# Patient Record
Sex: Male | Born: 1995 | Race: Black or African American | Hispanic: No | Marital: Single | Smoking: Never smoker
Health system: Southern US, Community
[De-identification: ages and names within clinical notes are randomized; demographics above are authoritative.]

---

## 2021-07-09 ENCOUNTER — Other Ambulatory Visit (HOSPITAL_BASED_OUTPATIENT_CLINIC_OR_DEPARTMENT_OTHER): Payer: Self-pay

## 2021-07-09 ENCOUNTER — Emergency Department (HOSPITAL_BASED_OUTPATIENT_CLINIC_OR_DEPARTMENT_OTHER)
Admission: EM | Admit: 2021-07-09 | Discharge: 2021-07-09 | Disposition: A | Discharge status: Home / Self Care | Payer: Self-pay | Attending: Emergency Medicine | Admitting: Emergency Medicine

## 2021-07-09 ENCOUNTER — Emergency Department (HOSPITAL_BASED_OUTPATIENT_CLINIC_OR_DEPARTMENT_OTHER): Payer: Self-pay

## 2021-07-09 ENCOUNTER — Other Ambulatory Visit: Payer: Self-pay

## 2021-07-09 ENCOUNTER — Encounter (HOSPITAL_BASED_OUTPATIENT_CLINIC_OR_DEPARTMENT_OTHER): Payer: Self-pay | Admitting: Emergency Medicine

## 2021-07-09 DIAGNOSIS — M21822 Other specified acquired deformities of left upper arm: Secondary | ICD-10-CM

## 2021-07-09 DIAGNOSIS — X58XXXA Exposure to other specified factors, initial encounter: Secondary | ICD-10-CM | POA: Insufficient documentation

## 2021-07-09 DIAGNOSIS — M25511 Pain in right shoulder: Secondary | ICD-10-CM

## 2021-07-09 DIAGNOSIS — S42295A Other nondisplaced fracture of upper end of left humerus, initial encounter for closed fracture: Secondary | ICD-10-CM | POA: Insufficient documentation

## 2021-07-09 DIAGNOSIS — M25411 Effusion, right shoulder: Secondary | ICD-10-CM

## 2021-07-09 DIAGNOSIS — S42292A Other displaced fracture of upper end of left humerus, initial encounter for closed fracture: Secondary | ICD-10-CM

## 2021-07-09 MED ORDER — METHOCARBAMOL 500 MG PO TABS
500.0000 mg | ORAL_TABLET | Freq: Two times a day (BID) | ORAL | 0 refills | Status: DC
  Filled 2021-07-09: qty 20, 10d supply, fill #0

## 2021-07-09 MED ORDER — DICLOFENAC SODIUM 50 MG PO TBEC
50.0000 mg | DELAYED_RELEASE_TABLET | Freq: Two times a day (BID) | ORAL | 0 refills | Status: AC
  Filled 2021-07-09: qty 20, 10d supply, fill #0

## 2021-07-09 NOTE — Discharge Instructions (Addendum)
Take pain medication as prescribed. Follow up with sports medicine, call to schedule an appointment.  Alternate warm/cold compresses to shoulder followed by gentle range of motion exercises.   Hill Sachs deformity (left shoulder) is an injury related to your prior dislocation, this is common after a shoulder dislocation. The right shoulder joint with mild swelling.

## 2021-07-09 NOTE — ED Provider Notes (Signed)
[facility] EMERGENCY DEPARTMENT Provider Note   CSN: [id number] Arrival date & time: 07/09/21  1012     History Chief Complaint  Patient presents with   Shoulder Pain    Alexander Wilson is a 25 y.o. male.  25 year old male with no significant past medical history presents with complaint of pain in both of his shoulders.  Patient reports dislocation of his shoulders in the past, most recently had dislocation of his left shoulder about 1 month ago, reduced at [facility], did not follow-up with orthopedics.  States that he has ongoing pain in his shoulders which is now resulting in limited range of motion of the shoulders.  No recent injuries otherwise.  No other complaints or concerns.      History reviewed. No pertinent past medical history.  There are no problems to display for this patient.   History reviewed. No pertinent surgical history.     No family history on file.  Social History   Tobacco Use   Smoking status: Never    Passive exposure: Never   Smokeless tobacco: Never  Vaping Use   Vaping Use: Never used  Substance Use Topics   Alcohol use: Yes   Drug use: Yes    Types: Marijuana    Home Medications Prior to Admission medications   Medication Sig Start Date End Date Taking? Authorizing Provider  diclofenac (VOLTAREN) 50 MG EC tablet Take 1 tablet (50 mg total) by mouth 2 (two) times daily for 10 days. 07/09/21 07/19/21 Yes Jeannie Fend, PA-C  methocarbamol (ROBAXIN) 500 MG tablet Take 1 tablet (500 mg total) by mouth 2 (two) times daily. 07/09/21  Yes Jeannie Fend, PA-C    Allergies    Patient has no known allergies.  Review of Systems   Review of Systems  Constitutional:  Negative for fever.  Musculoskeletal:  Positive for arthralgias and myalgias. Negative for back pain, neck pain and neck stiffness.  Skin:  Negative for rash and wound.  Allergic/Immunologic: Negative for immunocompromised state.  Neurological:   Positive for weakness. Negative for numbness.   Physical Exam Updated Vital Signs BP 131/89 (BP Location: Right Arm)   Pulse (!) 59   Temp 98.2 F (36.8 C) (Oral)   Resp 16   Ht 6\' 2"  (1.88 m)   Wt 86.2 kg   SpO2 100%   BMI 24.39 kg/m   Physical Exam Vitals and nursing note reviewed.  Constitutional:      General: He is not in acute distress.    Appearance: He is well-developed. He is not diaphoretic.  HENT:     Head: Normocephalic and atraumatic.  Cardiovascular:     Pulses: Normal pulses.  Pulmonary:     Effort: Pulmonary effort is normal.  Musculoskeletal:        General: Tenderness present. No swelling, deformity or signs of injury.     Right shoulder: Tenderness present. No swelling, effusion, laceration, bony tenderness or crepitus. Decreased range of motion.     Left shoulder: Tenderness present. No swelling, effusion, laceration, bony tenderness or crepitus. Decreased range of motion.  Skin:    General: Skin is warm and dry.     Capillary Refill: Capillary refill takes less than 2 seconds.     Findings: No erythema, lesion or rash.  Neurological:     Mental Status: He is alert and oriented to person, place, and time.     Sensory: No sensory deficit.  Psychiatric:  Behavior: Behavior normal.    ED Results / Procedures / Treatments   Labs (all labs ordered are listed, but only abnormal results are displayed) Labs Reviewed - No data to display  EKG None  Radiology DG Shoulder Right  Result Date: 07/09/2021 CLINICAL DATA:  Right shoulder pain since recent dislocation. EXAM: RIGHT SHOULDER - 2+ VIEW COMPARISON:  None. FINDINGS: No acute fracture or dislocation. Slight inferior subluxation of the humeral head. Joint spaces are preserved. Soft tissues are unremarkable. IMPRESSION: 1. Slight inferior subluxation of the humeral head could reflect underlying joint effusion. No fracture. Electronically Signed   By: Obie Dredge M.D.   On: 07/09/2021 11:26    DG Shoulder Left  Result Date: 07/09/2021 CLINICAL DATA:  Left shoulder pain since dislocation last month. EXAM: LEFT SHOULDER - 2+ VIEW COMPARISON:  Left shoulder x-rays dated May 24, 2021. FINDINGS: Similar impaction of the posterosuperior humeral head, best appreciated on the axillary view. No new fracture. No dislocation. Joint spaces are preserved. Soft tissues are unremarkable. IMPRESSION: 1. 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 No new abnormality. Electronically Signed   By: Obie Dredge M.D.   On: 07/09/2021 11:24    Procedures Procedures   Medications Ordered in ED Medications - No data to display  ED Course  I have reviewed the triage vital signs and the nursing notes.  Pertinent labs & imaging results that were available during my care of the patient were reviewed by me and considered in my medical decision making (see chart for details).  Clinical Course as of 07/09/21 1143  Thu Jul 09, 2021  [id number] 25 year old male with complaint of bilateral shoulder pain as above.  Found to have generalized shoulder pain with palpation as well as with range of motion, unable to raise arms over shoulder height.  Hand and forearm strength symmetric, radial pulses present, sensation intact.  No neck or back tenderness. X-ray of right shoulder with slight effusion, x-ray left shoulder with Hill-Sachs deformity, also seen on prior x-ray after reduction last month. Plan is to start 10-day course of NSAID, referred to sports medicine for further evaluation management.  Patient verbalizes understanding of importance of follow-up with orthopedics for further care. [LM]    Clinical Course User Index [LM] Alden Hipp   MDM Rules/Calculators/A&P                           Final Clinical Impression(s) / ED Diagnoses Final diagnoses:  [name] deformity, left  Effusion of joint of right shoulder  Acute pain of both shoulders    Rx / DC Orders ED Discharge  Orders          Ordered    diclofenac (VOLTAREN) 50 MG EC tablet  2 times daily        07/09/21 1133    methocarbamol (ROBAXIN) 500 MG tablet  2 times daily        07/09/21 1133             Alden Hipp 07/09/21 1143    Gloris Manchester, MD 07/09/21 2150

## 2021-07-09 NOTE — ED Triage Notes (Signed)
States has  popped out both shoulders in the last month and popped them back in now both shoulders hurt

## 2023-06-14 ENCOUNTER — Other Ambulatory Visit: Payer: Self-pay

## 2023-06-14 ENCOUNTER — Ambulatory Visit: Payer: Medicaid Other

## 2023-06-14 ENCOUNTER — Ambulatory Visit
Admission: EM | Admit: 2023-06-14 | Discharge: 2023-06-14 | Disposition: A | Discharge status: Home / Self Care | Payer: Medicaid Other | Attending: Urgent Care | Admitting: Urgent Care

## 2023-06-14 DIAGNOSIS — R0602 Shortness of breath: Secondary | ICD-10-CM

## 2023-06-14 DIAGNOSIS — R051 Acute cough: Secondary | ICD-10-CM | POA: Insufficient documentation

## 2023-06-14 DIAGNOSIS — R059 Cough, unspecified: Secondary | ICD-10-CM | POA: Diagnosis not present

## 2023-06-14 DIAGNOSIS — S39012A Strain of muscle, fascia and tendon of lower back, initial encounter: Secondary | ICD-10-CM | POA: Diagnosis present

## 2023-06-14 DIAGNOSIS — N342 Other urethritis: Secondary | ICD-10-CM | POA: Diagnosis present

## 2023-06-14 MED ORDER — BENZONATATE 100 MG PO CAPS: 100.0000 mg | ORAL_CAPSULE | Freq: Three times a day (TID) | ORAL | 0 refills | Status: AC | PRN

## 2023-06-14 MED ORDER — METHOCARBAMOL 500 MG PO TABS: 500.0000 mg | ORAL_TABLET | Freq: Three times a day (TID) | ORAL | 0 refills | Status: AC | PRN

## 2023-06-14 MED ORDER — DOXYCYCLINE HYCLATE 100 MG PO CAPS: 100.0000 mg | ORAL_CAPSULE | Freq: Two times a day (BID) | ORAL | 0 refills | Status: AC

## 2023-06-14 NOTE — Discharge Instructions (Addendum)
I do not see evidence of infection on your chest x-ray. Your cough is likely secondary to your smoking as this causes irritation of the respiratory tract. I have called in a cough medication for you to use on an as-needed basis, but ultimately cutting back or stopping smoking would help prevent further irritation and prevention of chronic respiratory illness. Regarding your back, I have refilled the muscle relaxer that you have previously taken.  I would also recommend moist heat, stretching, and ibuprofen as needed.  We will send out a swab to test for STIs.  Please avoid all forms of intercourse until test results are received. Please start taking doxycycline, an antibiotic, twice daily for the next 7 days.

## 2023-06-14 NOTE — ED Triage Notes (Signed)
C/o cough and sob x 2-3 days. No fever.

## 2023-06-14 NOTE — ED Provider Notes (Signed)
[facility]    CSN: [id number] Arrival date & time: 06/14/23  1206      History   Chief Complaint Chief Complaint  Patient presents with   Cough    HPI Alexander Wilson is a 27 y.o. male.   27yo male presents today with c/o cough and SOB x 2 days. No fever. He admits to [facility] use and black and milds, cough is primarily only after smoking. States he has been smoking for "a long time."  He noted specks of blood in his phlegm last night, but none today. No hx of asthma. Has seasonal allergies, but does not take anything for it. Does endorse some anterior chest discomfort around the sternum which is reproducible to palpation. Does endorse night sweats.  He also is concerned about his lower back. He has been having pain for 2 weeks. 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 Denies changes to bowel/bladder, no radicular symptoms. Did not take anything for his back.  Additionally, patient reports concerns over some GU irritation, noted some white discharge several days ago but not today.  He would like to be tested for STIs.  He reports some vague discomfort to his scrotal region, but denies severe tenderness, swelling, rash, lesions.  He denies dysuria or urinary symptoms.   Cough   History reviewed. No pertinent past medical history.  There are no problems to display for this patient.   History reviewed. No pertinent surgical history.     Home Medications    Prior to Admission medications   Medication Sig Start Date End Date Taking? Authorizing Provider  benzonatate (TESSALON) 100 MG capsule Take 1 capsule (100 mg total) by mouth 3 (three) times daily as needed for cough. 06/14/23  Yes Laguana Desautel L, PA  doxycycline (VIBRAMYCIN) 100 MG capsule Take 1 capsule (100 mg total) by mouth 2 (two) times daily for 7 days. 06/14/23 06/21/23 Yes Lashika Erker L, PA   methocarbamol (ROBAXIN) 500 MG tablet Take 1 tablet (500 mg total) by mouth every 8 (eight) hours as needed for muscle spasms. 06/14/23  Yes Zakiyyah Savannah L, PA  metroNIDAZOLE (FLAGYL) 500 MG tablet Take 1 tablet (500 mg total) by mouth 2 (two) times daily. 06/16/23   Maretta Bees, PA    Family History Family History  Problem Relation Age of Onset   Healthy Mother     Social History Social History   Tobacco Use   Smoking status: Never    Passive exposure: Never   Smokeless tobacco: Never  Vaping Use   Vaping status: Never Used  Substance Use Topics   Alcohol use: Yes   Drug use: Yes    Types: Marijuana     Allergies   Patient has no known allergies.   Review of Systems Review of Systems  Respiratory:  Positive for cough.   As per HPI   Physical Exam Triage Vital Signs ED Triage Vitals  Encounter Vitals Group     BP 06/14/23 1219 126/85     Systolic BP Percentile --      Diastolic BP Percentile --      Pulse Rate 06/14/23 1219 80     Resp 06/14/23 1219 16     Temp 06/14/23 1219 98.2 F (36.8 C)     Temp src --      SpO2 06/14/23 1219 99 %  Weight --      Height --      Head Circumference --      Peak Flow --      Pain Score 06/14/23 1220 0     Pain Loc --      Pain Education --      Exclude from Growth Chart --    No data found.  Updated Vital Signs BP 126/85 (BP Location: Left Arm)   Pulse 80   Temp 98.2 F (36.8 C)   Resp 16   SpO2 99%   Visual Acuity Right Eye Distance:   Left Eye Distance:   Bilateral Distance:    Right Eye Near:   Left Eye Near:    Bilateral Near:     Physical Exam Vitals and nursing note reviewed. Exam conducted with a chaperone present.  Constitutional:      General: He is not in acute distress.    Appearance: Normal appearance. He is normal weight. He is not ill-appearing, toxic-appearing or diaphoretic.  HENT:     Head: Normocephalic and atraumatic.     Right Ear: Tympanic membrane, ear canal and  external ear normal. There is no impacted cerumen.     Left Ear: Tympanic membrane, ear canal and external ear normal. There is no impacted cerumen.     Nose: Nose normal. No congestion or rhinorrhea.     Mouth/Throat:     Mouth: Mucous membranes are moist.     Pharynx: Oropharynx is clear. No oropharyngeal exudate or posterior oropharyngeal erythema.  Eyes:     General: No scleral icterus.       Right eye: No discharge.        Left eye: No discharge.     Extraocular Movements: Extraocular movements intact.     Conjunctiva/sclera: Conjunctivae normal.     Pupils: Pupils are equal, round, and reactive to light.  Cardiovascular:     Rate and Rhythm: Normal rate and regular rhythm.     Pulses: Normal pulses.     Heart sounds: Normal heart sounds. No murmur heard.    No friction rub. No gallop.  Pulmonary:     Effort: Pulmonary effort is normal. No respiratory distress.     Breath sounds: Rhonchi (LUL) present. No wheezing or rales.  Chest:     Chest wall: Tenderness (mild tenderness to sternum) present.  Abdominal:     General: Abdomen is flat.  Genitourinary:    Pubic Area: No rash.      Penis: Normal and circumcised. No tenderness, discharge, swelling or lesions.      Testes: Normal.        Right: Mass, tenderness, swelling, testicular hydrocele or varicocele not present.        Left: Mass, tenderness, swelling, testicular hydrocele or varicocele not present.     Epididymis:     Right: Normal. Not inflamed or enlarged. No tenderness.     Left: Normal. Not inflamed or enlarged. No tenderness.  Musculoskeletal:        General: No swelling. Normal range of motion.     Cervical back: Normal range of motion and neck supple. No rigidity or tenderness.     Thoracic back: Normal. No swelling, deformity, spasms, tenderness or bony tenderness. Normal range of motion.     Lumbar back: Tenderness (mild discomfort to paralumbar muscles) present. No swelling, deformity, lacerations, spasms or  bony tenderness. Normal range of motion. Negative right straight leg raise test and negative left straight leg raise  test.     Right lower leg: No edema.     Left lower leg: No edema.  Lymphadenopathy:     Cervical: No cervical adenopathy.     Lower Body: No right inguinal adenopathy. No left inguinal adenopathy.  Skin:    General: Skin is warm.     Findings: No erythema or rash.  Neurological:     General: No focal deficit present.     Mental Status: He is alert and oriented to person, place, and time.      UC Treatments / Results  Labs (all labs ordered are listed, but only abnormal results are displayed) Labs Reviewed  CYTOLOGY, (ORAL, ANAL, URETHRAL) ANCILLARY ONLY     EKG   Radiology No results found.  Procedures Procedures (including critical care time)  Medications Ordered in UC Medications - No data to display  Initial Impression / Assessment and Plan / UC Course  I have reviewed the triage vital signs and the nursing notes.  Pertinent labs & imaging results that were available during my care of the patient were reviewed by me and considered in my medical decision making (see chart for details).     Acute cough - CXR negative for TB or other infectious process. VSS. Suspect secondary to respiratory irritants. No significant wheezing noted. Will give PRN tessalon. Smoking cessation encouraged. Low back strain - most consistent with strain from overuse. Encouraged NSAIDs. Will also do trial of robaxin. Urethritis - Urethral cytology obtained. Will start empiric doxycycline BID.    Final Clinical Impressions(s) / UC Diagnoses   Final diagnoses:  Acute cough  Low back strain, initial encounter  Urethritis     Discharge Instructions      I do not see evidence of infection on your chest x-ray. Your cough is likely secondary to your smoking as this causes irritation of the respiratory tract. I have called in a cough medication for you to use on an  as-needed basis, but ultimately cutting back or stopping smoking would help prevent further irritation and prevention of chronic respiratory illness. Regarding your back, I have refilled the muscle relaxer that you have previously taken.  I would also recommend moist heat, stretching, and ibuprofen as needed.  We will send out a swab to test for STIs.  Please avoid all forms of intercourse until test results are received. Please start taking doxycycline, an antibiotic, twice daily for the next 7 days.     ED Prescriptions     Medication Sig Dispense Auth. Provider   benzonatate (TESSALON) 100 MG capsule Take 1 capsule (100 mg total) by mouth 3 (three) times daily as needed for cough. 21 capsule Omesha Bowerman L, PA   methocarbamol (ROBAXIN) 500 MG tablet Take 1 tablet (500 mg total) by mouth every 8 (eight) hours as needed for muscle spasms. 20 tablet Adasia Hoar L, PA   doxycycline (VIBRAMYCIN) 100 MG capsule Take 1 capsule (100 mg total) by mouth 2 (two) times daily for 7 days. 14 capsule Monda Chastain L, PA      PDMP not reviewed this encounter.   Maretta Bees, [place] 06/17/23 [phone]

## 2023-06-16 ENCOUNTER — Other Ambulatory Visit: Payer: Self-pay | Admitting: Urgent Care

## 2023-06-16 ENCOUNTER — Telehealth: Payer: Self-pay | Admitting: Emergency Medicine

## 2023-06-16 ENCOUNTER — Other Ambulatory Visit (HOSPITAL_BASED_OUTPATIENT_CLINIC_OR_DEPARTMENT_OTHER): Payer: Self-pay

## 2023-06-16 DIAGNOSIS — A599 Trichomoniasis, unspecified: Secondary | ICD-10-CM

## 2023-06-16 LAB — CYTOLOGY, (ORAL, ANAL, URETHRAL) ANCILLARY ONLY
Chlamydia: NEGATIVE
Comment: NEGATIVE
Comment: NEGATIVE
Comment: NORMAL
Neisseria Gonorrhea: NEGATIVE
Trichomonas: POSITIVE — AB

## 2023-06-16 MED ORDER — METRONIDAZOLE 500 MG PO TABS
500.0000 mg | ORAL_TABLET | Freq: Two times a day (BID) | ORAL | 0 refills | Status: AC
  Filled 2023-06-16: qty 14, 7d supply, fill #0

## 2023-06-16 NOTE — Telephone Encounter (Signed)
Was given instructions by provider to notify patient of lab results and new treatment plan. Was unable to get in touch with patient, left a partial voicemail as the call spontaneously ended mid-message. Notified provider.

## 2023-06-17 ENCOUNTER — Telehealth: Payer: Self-pay

## 2023-06-28 ENCOUNTER — Other Ambulatory Visit (HOSPITAL_BASED_OUTPATIENT_CLINIC_OR_DEPARTMENT_OTHER): Payer: Self-pay

## 2023-07-30 IMAGING — CR DG SHOULDER 2+V*R*
3 series · 3 of 3 positions shown · non-contrast
Comparison: None.

CLINICAL DATA: Right shoulder pain since recent dislocation.

EXAM:
RIGHT SHOULDER - 2+ VIEW

[w shoulder grashey right]
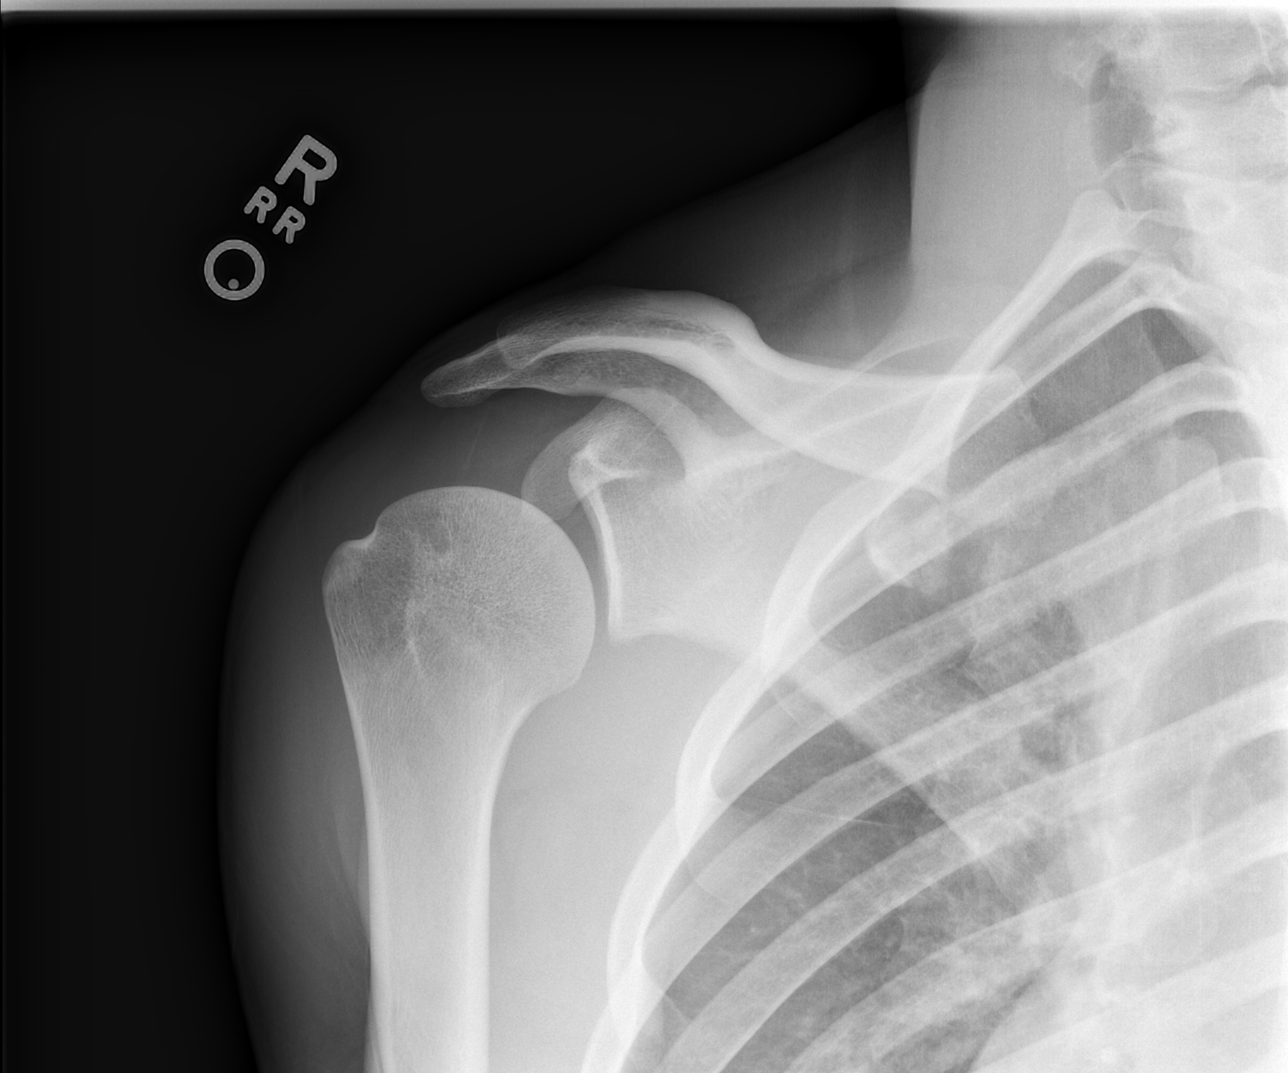

[w shoulder y view right]
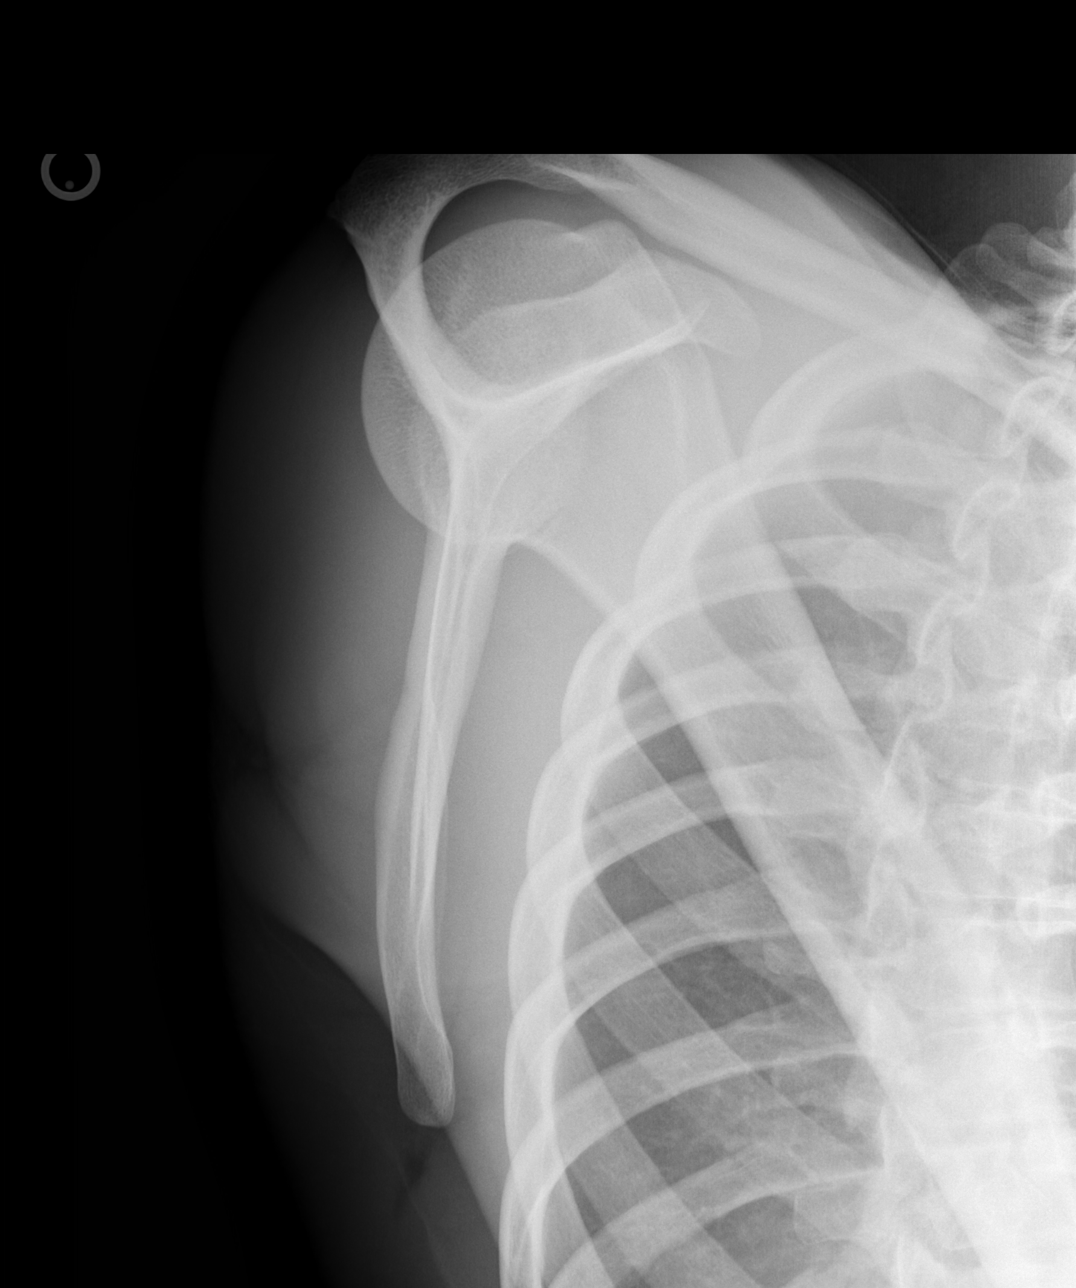

[x shoulder axillary right *]
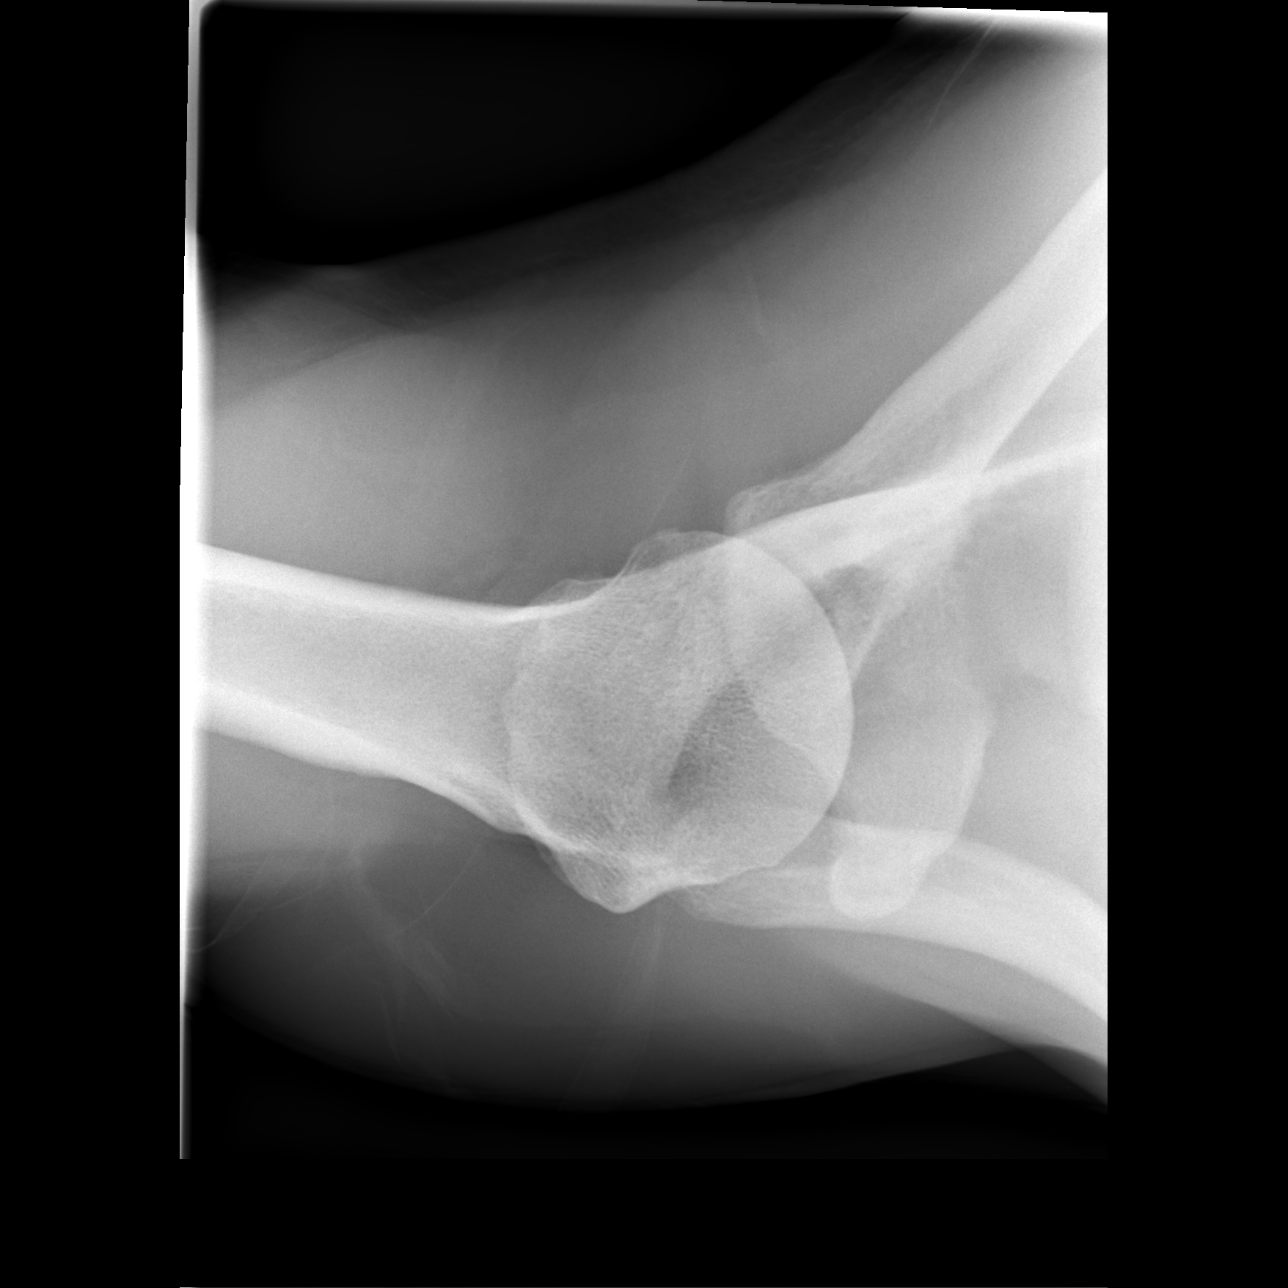

[3 of 3 positions shown; findings below may reference images not displayed]

FINDINGS: No acute fracture or dislocation. Slight inferior subluxation of the
humeral head. Joint spaces are preserved. Soft tissues are
unremarkable.
IMPRESSION: 1. Slight inferior subluxation of the humeral head could reflect
underlying joint effusion. No fracture.

## 2023-07-30 IMAGING — CR DG SHOULDER 2+V*L*
3 series · 3 of 3 positions shown · non-contrast
Comparison: Left shoulder x-rays dated May 24, 2021.

CLINICAL DATA: Left shoulder pain since dislocation last month.

EXAM:
LEFT SHOULDER - 2+ VIEW

[w shoulder grashey left]
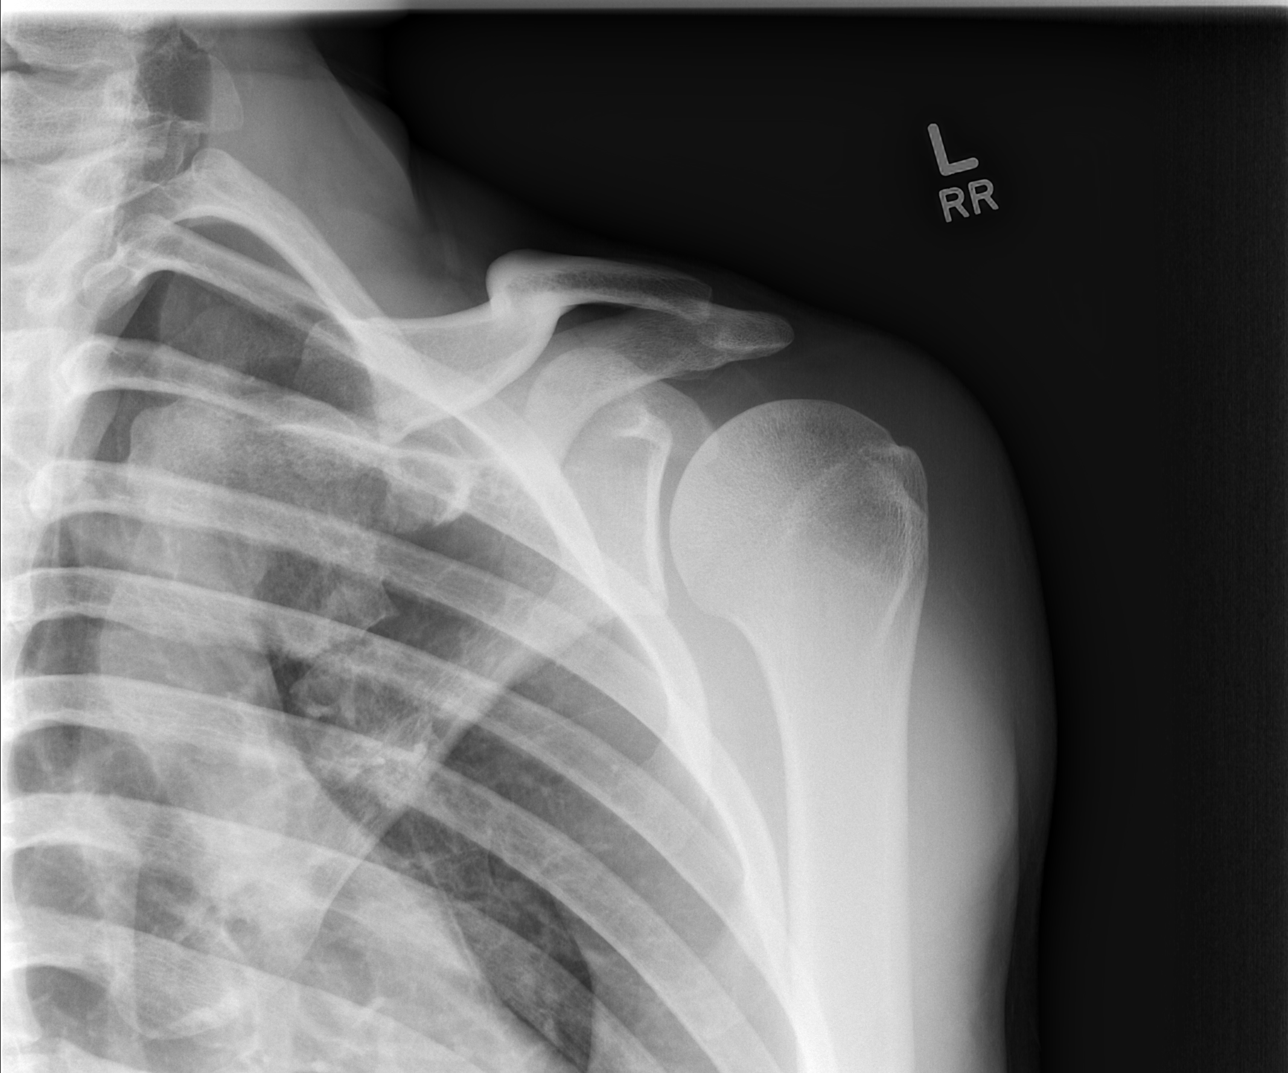

[w shoulder y view left]
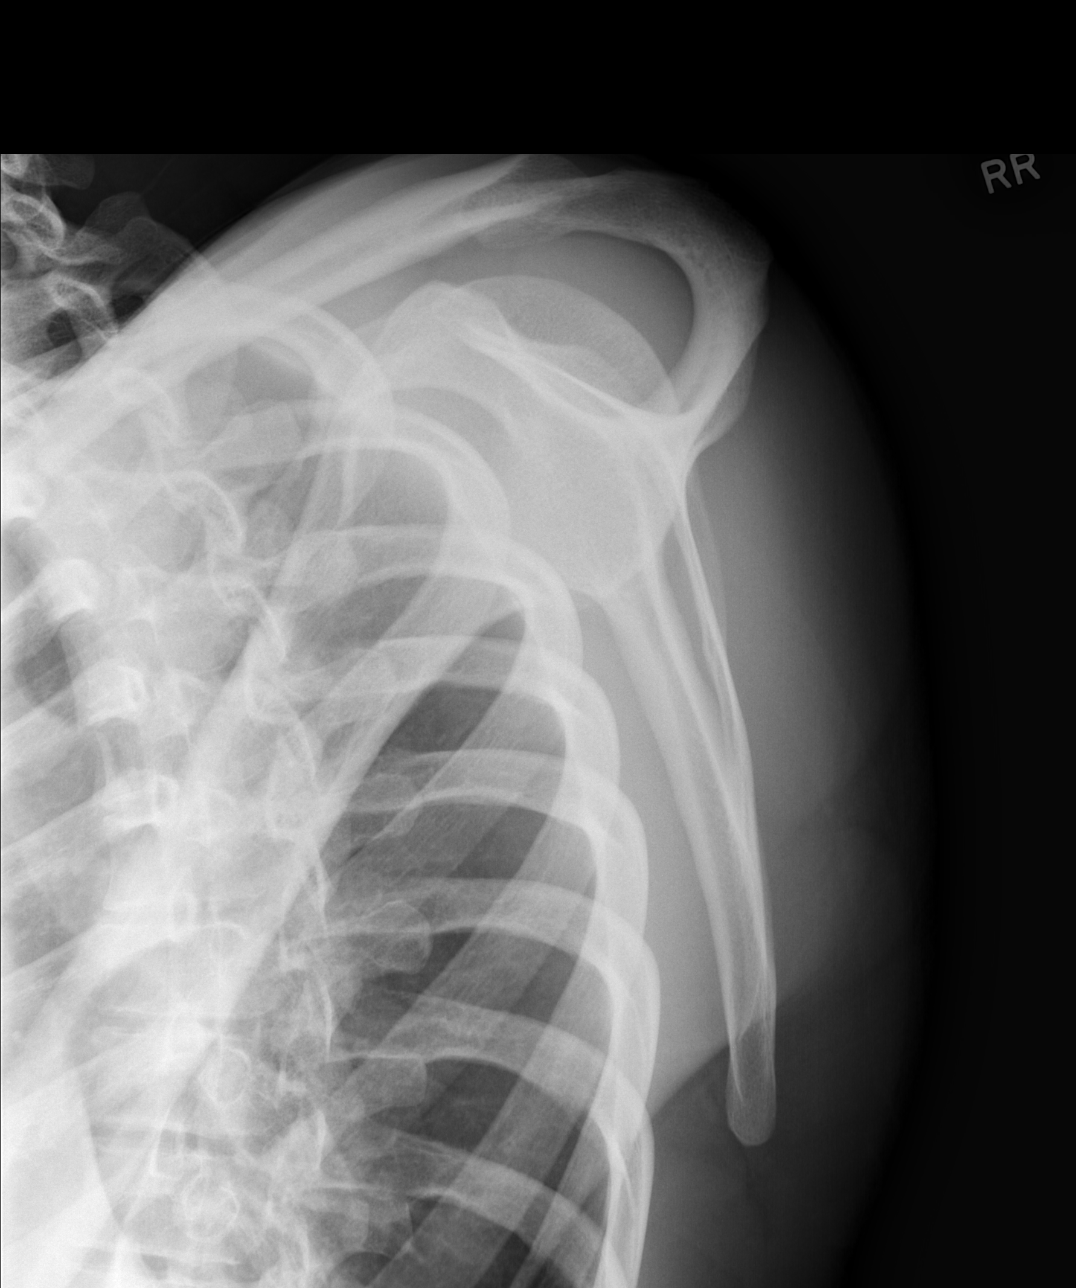

[x shoulder axillary left]
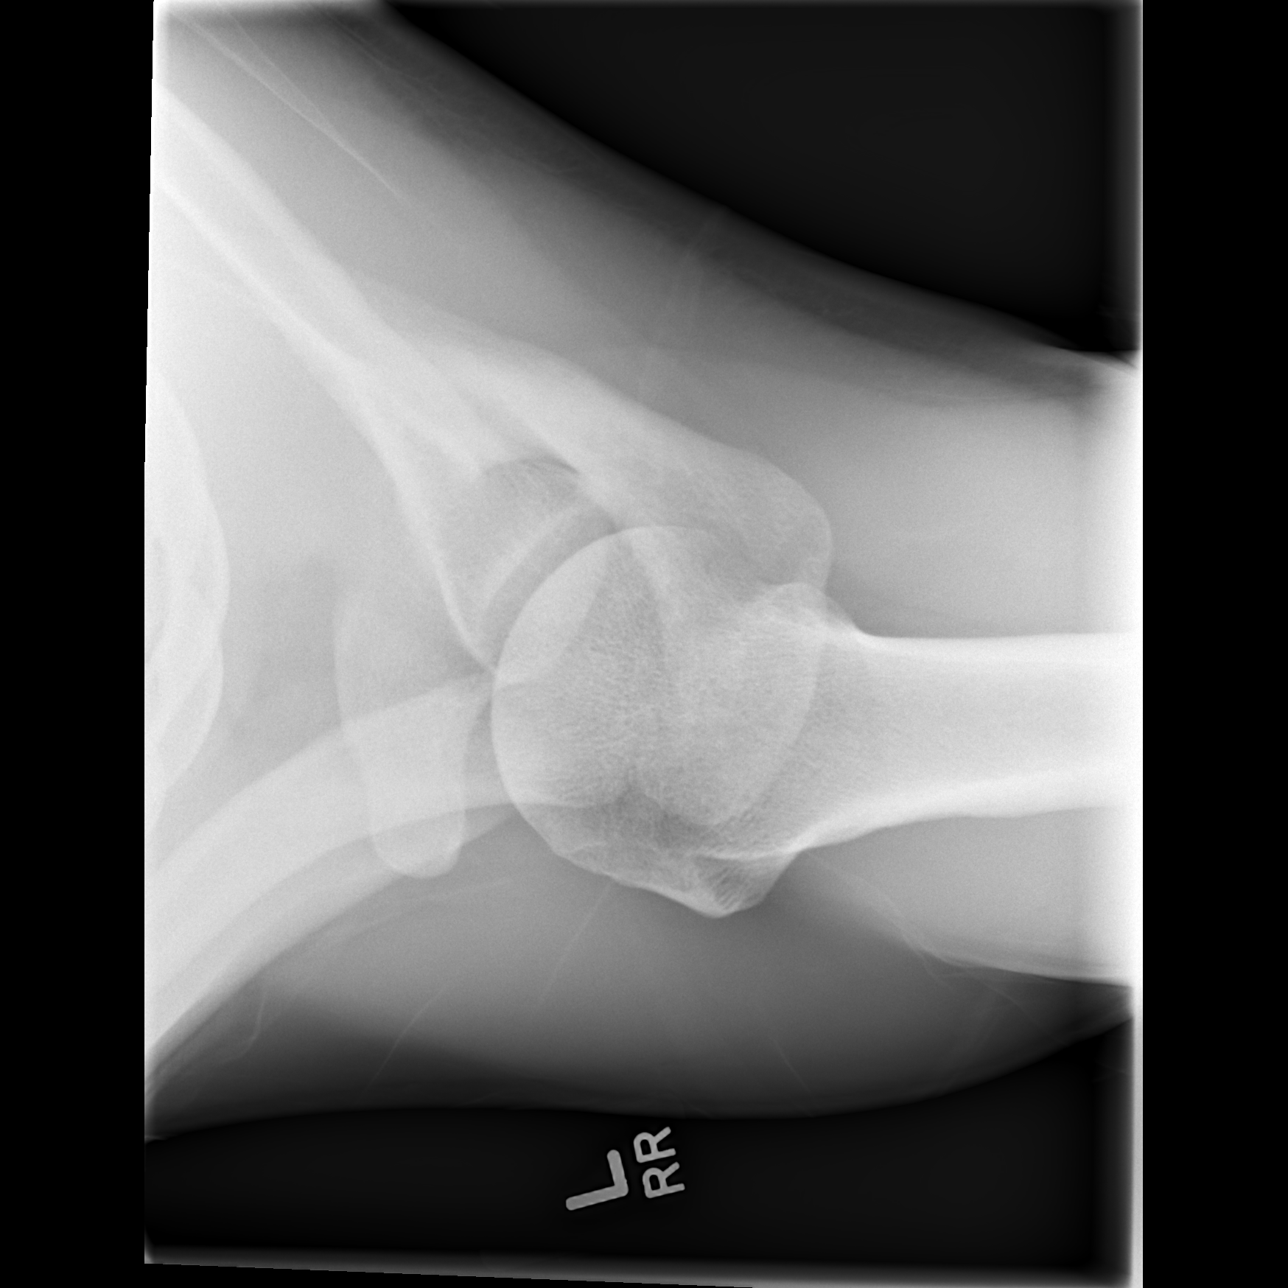

[3 of 3 positions shown; findings below may reference images not displayed]

FINDINGS: Similar impaction of the posterosuperior humeral head, best
appreciated on the axillary view. No new fracture. No dislocation.
Joint spaces are preserved. Soft tissues are unremarkable.
IMPRESSION: 1. Unchanged Hill-Sachs deformity of the humeral head. No new
abnormality.

## 2023-10-11 ENCOUNTER — Other Ambulatory Visit: Payer: Self-pay

## 2023-10-11 ENCOUNTER — Ambulatory Visit
Admission: EM | Admit: 2023-10-11 | Discharge: 2023-10-11 | Disposition: A | Discharge status: Home / Self Care | Payer: Medicaid Other | Attending: Family Medicine | Admitting: Family Medicine

## 2023-10-11 ENCOUNTER — Encounter: Payer: Self-pay | Admitting: Emergency Medicine

## 2023-10-11 DIAGNOSIS — R0989 Other specified symptoms and signs involving the circulatory and respiratory systems: Secondary | ICD-10-CM | POA: Diagnosis present

## 2023-10-11 DIAGNOSIS — R300 Dysuria: Secondary | ICD-10-CM | POA: Insufficient documentation

## 2023-10-11 LAB — POCT URINALYSIS DIP (MANUAL ENTRY)
Bilirubin, UA: NEGATIVE
Blood, UA: NEGATIVE
Glucose, UA: NEGATIVE mg/dL
Ketones, POC UA: NEGATIVE mg/dL
Leukocytes, UA: NEGATIVE
Nitrite, UA: NEGATIVE
Protein Ur, POC: NEGATIVE mg/dL
Spec Grav, UA: >=1.03 — AB
Urobilinogen, UA: 0.2 U/dL
pH, UA: 6

## 2023-10-11 MED ORDER — CEFDINIR 300 MG PO CAPS: 300.0000 mg | ORAL_CAPSULE | Freq: Two times a day (BID) | ORAL | 0 refills | Status: AC

## 2023-10-11 NOTE — ED Triage Notes (Addendum)
Patient c/o chest congestion, heaviness in chest for several weeks.  At times he feels SOB, possibly heart burn.  Denies any cough.  Patient also states that he's having some penile discomfort.  Patient having dysuria x 2 days, denies any discharge.

## 2023-10-11 NOTE — Discharge Instructions (Addendum)
Advised patient UA was unremarkable, urine culture ordered.  Advised patient to take medication as directed with food to completion.  Encouraged to increase daily water intake to 64 ounces per day.  Advised we will follow-up with urine culture results once received.

## 2023-10-11 NOTE — ED Provider Notes (Signed)
[facility]    CSN: [id number] Arrival date & time: 10/11/23  1908      History   Chief Complaint Chief Complaint  Patient presents with   Chest Congestion    HPI Alexander Wilson is a 28 y.o. male.   HPI 28 year old male presents with chest congestion and dysuria for 2 to 3 days.  Patient is accompanied by his significant other today.  History reviewed. No pertinent past medical history.  There are no active problems to display for this patient.   History reviewed. No pertinent surgical history.     Home Medications    Prior to Admission medications   Medication Sig Start Date End Date Taking? Authorizing Provider  cefdinir (OMNICEF) 300 MG capsule Take 1 capsule (300 mg total) by mouth 2 (two) times daily for 7 days. 10/11/23 10/18/23 Yes Trevor Iha, FNP  benzonatate (TESSALON) 100 MG capsule Take 1 capsule (100 mg total) by mouth 3 (three) times daily as needed for cough. 06/14/23   Crain, Whitney L, PA  methocarbamol (ROBAXIN) 500 MG tablet Take 1 tablet (500 mg total) by mouth every 8 (eight) hours as needed for muscle spasms. 06/14/23   Crain, Whitney L, PA  metroNIDAZOLE (FLAGYL) 500 MG tablet Take 1 tablet (500 mg total) by mouth 2 (two) times daily. 06/16/23   Maretta Bees, PA    Family History Family History  Problem Relation Age of Onset   Healthy Mother    Healthy Father     Social History Social History   Tobacco Use   Smoking status: Never    Passive exposure: Never   Smokeless tobacco: Never  Vaping Use   Vaping status: Never Used  Substance Use Topics   Alcohol use: Yes   Drug use: Yes    Types: Marijuana     Allergies   Patient has no known allergies.   Review of Systems Review of Systems  Respiratory:  Positive for cough.   Genitourinary:  Positive for dysuria.  All other systems reviewed and are negative.    Physical Exam Triage Vital Signs ED Triage Vitals  Encounter Vitals Group     BP 10/11/23 1922  116/76     Systolic BP Percentile --      Diastolic BP Percentile --      Pulse Rate 10/11/23 1922 83     Resp 10/11/23 1922 18     Temp 10/11/23 1922 97.9 F (36.6 C)     Temp Source 10/11/23 1922 Oral     SpO2 10/11/23 1922 97 %     Weight 10/11/23 1923 185 lb (83.9 kg)     Height 10/11/23 1923 6\' 2"  (1.88 m)     Head Circumference --      Peak Flow --      Pain Score 10/11/23 1923 0     Pain Loc --      Pain Education --      Exclude from Growth Chart --    No data found.  Updated Vital Signs BP 116/76 (BP Location: Right Arm)   Pulse 83   Temp 97.9 F (36.6 C) (Oral)   Resp 18   Ht 6\' 2"  (1.88 m)   Wt 185 lb (83.9 kg)   SpO2 97%   BMI 23.75 kg/m    Physical Exam Vitals and nursing note reviewed.  Constitutional:      Appearance: Normal appearance. He is normal weight.  HENT:     Head: Normocephalic and  atraumatic.     Right Ear: Tympanic membrane, ear canal and external ear normal.     Left Ear: Tympanic membrane, ear canal and external ear normal.     Mouth/Throat:     Pharynx: Oropharynx is clear.  Eyes:     Extraocular Movements: Extraocular movements intact.     Conjunctiva/sclera: Conjunctivae normal.     Pupils: Pupils are equal, round, and reactive to light.  Cardiovascular:     Rate and Rhythm: Normal rate and regular rhythm.     Pulses: Normal pulses.     Heart sounds: Normal heart sounds.  Pulmonary:     Effort: Pulmonary effort is normal.     Breath sounds: Normal breath sounds. No wheezing, rhonchi or rales.  Musculoskeletal:        General: Normal range of motion.     Cervical back: Normal range of motion and neck supple.  Skin:    General: Skin is warm and dry.  Neurological:     General: No focal deficit present.     Mental Status: He is alert. Mental status is at baseline. He is disoriented.  Psychiatric:        Mood and Affect: Mood normal.        Behavior: Behavior normal.      UC Treatments / Results  Labs (all labs ordered  are listed, but only abnormal results are displayed) Labs Reviewed  POCT URINALYSIS DIP (MANUAL ENTRY) - Abnormal; Notable for the following components:      Result Value   Spec Grav, UA >=1.030 (*)    All other components within normal limits  URINE CULTURE    EKG   Radiology No results found.  Procedures Procedures (including critical care time)  Medications Ordered in UC Medications - No data to display  Initial Impression / Assessment and Plan / UC Course  I have reviewed the triage vital signs and the nursing notes.  Pertinent labs & imaging results that were available during my care of the patient were reviewed by me and considered in my medical decision making (see chart for details).     MDM: 1.  Chest congestion-Rx'd cefdinir 300 mg capsule: Take 1 capsule twice daily x 7 days.  2.  Dysuria-UA revealed above, urine culture ordered. Advised patient UA was unremarkable, urine culture ordered.  Encouraged to increase daily water intake to 64 ounces per day.  Advised we will follow-up with urine culture results once received.  Patient discharged home, hemodynamically stable. Final Clinical Impressions(s) / UC Diagnoses   Final diagnoses:  Dysuria  Chest congestion     Discharge Instructions      Advised patient UA was unremarkable, urine culture ordered.  Advised patient to take medication as directed with food to completion.  Encouraged to increase daily water intake to 64 ounces per day.  Advised we will follow-up with urine culture results once received.     ED Prescriptions     Medication Sig Dispense Auth. Provider   cefdinir (OMNICEF) 300 MG capsule Take 1 capsule (300 mg total) by mouth 2 (two) times daily for 7 days. 14 capsule Trevor Iha, FNP      PDMP not reviewed this encounter.   Trevor Iha, FNP 10/11/23 2009

## 2023-10-13 LAB — URINE CULTURE: Culture: NO GROWTH
